# Patient Record
Sex: Female | Born: 1960 | Hispanic: No | Marital: Single | Smoking: Never smoker
Health system: Southern US, Community
[De-identification: ages and names within clinical notes are randomized; demographics above are authoritative.]

---

## 2004-09-13 ENCOUNTER — Other Ambulatory Visit: Admission: RE | Admit: 2004-09-13 | Discharge: 2004-09-13 | Payer: Self-pay | Admitting: Family Medicine

## 2004-09-13 ENCOUNTER — Ambulatory Visit: Payer: Self-pay | Admitting: Family Medicine

## 2004-09-15 ENCOUNTER — Ambulatory Visit: Payer: Self-pay | Admitting: Family Medicine

## 2004-09-16 ENCOUNTER — Ambulatory Visit: Payer: Self-pay | Admitting: Family Medicine

## 2004-09-21 ENCOUNTER — Ambulatory Visit: Payer: Self-pay | Admitting: Oncology

## 2004-09-22 ENCOUNTER — Ambulatory Visit: Payer: Self-pay | Admitting: Family Medicine

## 2004-09-22 ENCOUNTER — Encounter: Admission: RE | Admit: 2004-09-22 | Discharge: 2004-09-22 | Payer: Self-pay | Admitting: Family Medicine

## 2004-09-29 ENCOUNTER — Ambulatory Visit: Payer: Self-pay | Admitting: Family Medicine

## 2004-11-07 ENCOUNTER — Ambulatory Visit: Payer: Self-pay | Admitting: Oncology

## 2005-11-03 ENCOUNTER — Encounter: Admission: RE | Admit: 2005-11-03 | Discharge: 2005-11-03 | Payer: Self-pay | Admitting: Family Medicine

## 2007-01-25 ENCOUNTER — Encounter: Admission: RE | Admit: 2007-01-25 | Discharge: 2007-01-25 | Payer: Self-pay | Admitting: Family Medicine

## 2008-02-14 ENCOUNTER — Encounter: Admission: RE | Admit: 2008-02-14 | Discharge: 2008-02-14 | Payer: Self-pay | Admitting: Family Medicine

## 2008-02-18 ENCOUNTER — Encounter (INDEPENDENT_AMBULATORY_CARE_PROVIDER_SITE_OTHER): Payer: Self-pay | Admitting: *Deleted

## 2018-05-04 ENCOUNTER — Other Ambulatory Visit: Payer: Self-pay

## 2018-05-04 ENCOUNTER — Emergency Department (HOSPITAL_BASED_OUTPATIENT_CLINIC_OR_DEPARTMENT_OTHER): Payer: BLUE CROSS/BLUE SHIELD

## 2018-05-04 ENCOUNTER — Encounter: Payer: Self-pay | Admitting: Family Medicine

## 2018-05-04 ENCOUNTER — Emergency Department (HOSPITAL_BASED_OUTPATIENT_CLINIC_OR_DEPARTMENT_OTHER)
Admission: EM | Admit: 2018-05-04 | Discharge: 2018-05-04 | Disposition: A | Discharge status: Home / Self Care | Payer: BLUE CROSS/BLUE SHIELD | Attending: Emergency Medicine | Admitting: Emergency Medicine

## 2018-05-04 ENCOUNTER — Ambulatory Visit: Payer: BLUE CROSS/BLUE SHIELD | Admitting: Family Medicine

## 2018-05-04 ENCOUNTER — Encounter (HOSPITAL_BASED_OUTPATIENT_CLINIC_OR_DEPARTMENT_OTHER): Payer: Self-pay | Admitting: Emergency Medicine

## 2018-05-04 VITALS — BP 132/81 | HR 92 | Temp 97.5°F | Resp 17 | Ht 65.0 in | Wt 147.0 lb

## 2018-05-04 DIAGNOSIS — R519 Headache, unspecified: Secondary | ICD-10-CM

## 2018-05-04 DIAGNOSIS — R51 Headache: Secondary | ICD-10-CM | POA: Diagnosis present

## 2018-05-04 DIAGNOSIS — Z1211 Encounter for screening for malignant neoplasm of colon: Secondary | ICD-10-CM | POA: Diagnosis not present

## 2018-05-04 DIAGNOSIS — Z1239 Encounter for other screening for malignant neoplasm of breast: Secondary | ICD-10-CM

## 2018-05-04 DIAGNOSIS — H519 Unspecified disorder of binocular movement: Secondary | ICD-10-CM

## 2018-05-04 LAB — BASIC METABOLIC PANEL
ANION GAP: 14 (ref 5–15)
BUN: 9 mg/dL (ref 6–20)
CO2: 26 mmol/L (ref 22–32)
CREATININE: 0.59 mg/dL (ref 0.44–1.00)
Calcium: 10 mg/dL (ref 8.9–10.3)
Chloride: 97 mmol/L — ABNORMAL LOW (ref 98–111)
GFR calc non Af Amer: >60 mL/min (ref >60)
GLUCOSE: 111 mg/dL — AB (ref 70–99)
Potassium: 3 mmol/L — ABNORMAL LOW (ref 3.5–5.1)
Sodium: 137 mmol/L (ref 135–145)

## 2018-05-04 LAB — CBC
HEMATOCRIT: 41 % (ref 36.0–46.0)
HEMOGLOBIN: 12 g/dL (ref 12.0–15.0)
MCH: 22.7 pg — ABNORMAL LOW (ref 26.0–34.0)
MCHC: 29.3 g/dL — AB (ref 30.0–36.0)
MCV: 77.7 fL — AB (ref 80.0–100.0)
Platelets: 339 10*3/uL (ref 150–400)
RBC: 5.28 MIL/uL — ABNORMAL HIGH (ref 3.87–5.11)
RDW: 16.8 % — AB (ref 11.5–15.5)
WBC: 10.6 10*3/uL — AB (ref 4.0–10.5)
nRBC: 0 % (ref 0.0–0.2)

## 2018-05-04 MED ORDER — PROCHLORPERAZINE EDISYLATE 10 MG/2ML IJ SOLN
10.0000 mg | Freq: Once | INTRAMUSCULAR | Status: AC
  Administered 2018-05-04: 10 mg via INTRAVENOUS
  Filled 2018-05-04: qty 2

## 2018-05-04 MED ORDER — BUTALBITAL-APAP-CAFFEINE 50-325-40 MG PO TABS: 1.0000 | ORAL_TABLET | Freq: Four times a day (QID) | ORAL | 0 refills | Status: AC | PRN

## 2018-05-04 MED ORDER — MORPHINE SULFATE (PF) 4 MG/ML IV SOLN
4.0000 mg | Freq: Once | INTRAVENOUS | Status: AC
  Administered 2018-05-04: 4 mg via INTRAVENOUS
  Filled 2018-05-04: qty 1

## 2018-05-04 MED ORDER — POTASSIUM CHLORIDE CRYS ER 20 MEQ PO TBCR
40.0000 meq | EXTENDED_RELEASE_TABLET | Freq: Once | ORAL | Status: AC
  Administered 2018-05-04: 40 meq via ORAL
  Filled 2018-05-04: qty 2

## 2018-05-04 MED ORDER — BUTALBITAL-APAP-CAFFEINE 50-325-40 MG PO TABS: 1.0000 | ORAL_TABLET | Freq: Four times a day (QID) | ORAL | 0 refills | Status: DC | PRN

## 2018-05-04 NOTE — Progress Notes (Signed)
Patient ID: Rachel Ortiz, female    DOB: June 19, 1961, 57 y.o.   MRN: [id number]  PCP: Patient, No Pcp Per  Chief Complaint  Patient presents with  . Headache    onset: Monday 04/29/18 and are getting progessively worse daily, ha's are intermittent and a "pulling sensation" and with the pulling sensation pain is 10/10 , pain is trobbing also and getting more frequent.  No sensitiviyt to light  or sound, no nausea or vomitting.  Stopped tylenol last night as it is not helping and leaves bad tase in mouth.    Subjective:  HPI Rachel Ortiz is a 57 y.o. female presents for evaluation of headache. Headache is unilateral. Headaches are new.  Patient has never had any prior history of migraines or head injuries.She reports acute onset of a pulling sensation on the left side of her head which began approximately 5 days ago. The pain is occurring on the left lower occipital area of her head and occurs intermittently as a strong pulling sensation.  She denies any left-sided facial weakness, visual disturbances, nausea, vomiting.  He has attempted relief with Tylenol which has not improved symptoms which only made her ill on her stomach.  Reports altered taste of food however has attributed that to taken the Tylenol.  Pain is occurring she characterizes it as a 10 out of 10.  She has had 2 painful episodes during her office visit and she is gripping her head while the pain is occurring.  Is currently not dizzy able to stand and reports no imbalance with walking. No apparent risk factors for CVA. She is a non-smoker, normotensive, no other no neurovascular disease. Social History   Socioeconomic History  . Marital status: Single    Spouse name: Not on file  . Number of children: Not on file  . Years of education: Not on file  . Highest education level: Not on file  Occupational History  . Not on file  Social Needs  . Financial resource strain: Not on file  . Food insecurity:    Worry: Not on file    Inability: Not  on file  . Transportation needs:    Medical: Not on file    Non-medical: Not on file  Tobacco Use  . Smoking status: Never Smoker  . Smokeless tobacco: Never Used  Substance and Sexual Activity  . Alcohol use: Never    Frequency: Never  . Drug use: Never  . Sexual activity: Not on file  Lifestyle  . Physical activity:    Days per week: Not on file    Minutes per session: Not on file  . Stress: Not on file  Relationships  . Social connections:    Talks on phone: Not on file    Gets together: Not on file    Attends religious service: Not on file    Active member of club or organization: Not on file    Attends meetings of clubs or organizations: Not on file    Relationship status: Not on file  . Intimate partner violence:    Fear of current or ex partner: Not on file    Emotionally abused: Not on file    Physically abused: Not on file    Forced sexual activity: Not on file  Other Topics Concern  . Not on file  Social History Narrative  . Not on file    No known family history of cardiovascular disease, cancer, lung disease, or diabetes.   Review of Systems Pertinent  negatives listed in HPI There are no active problems to display for this patient.   No Known Allergies  Prior to Admission medications   Not on File    Past Medical, Surgical Family and Social History reviewed and updated.    Objective:   Today's Vitals   05/04/18 1038  BP: 132/81  Pulse: 92  Resp: 17  Temp: (!) 97.5 F (36.4 C)  TempSrc: Oral  SpO2: 100%  Weight: 147 lb (66.7 kg)  Height: 5\' 5"  (1.651 m)    Wt Readings from Last 3 Encounters:  05/04/18 147 lb (66.7 kg)    Physical Exam General appearance: alert, well developed, well nourished, cooperative and in no distress Head: Normocephalic, without obvious abnormality, atraumatic Respiratory: Respirations even and unlabored, normal respiratory rate Heart: rate and rhythm normal. No gallop or murmurs noted on exam  Extremities:  No gross deformities Skin: Skin color, texture, turgor normal. No rashes seen  Psych: Appropriate mood and affect. Neurologic: Mental status: Alert, oriented to person, place, and time, thought content appropriate.  During ocular exam patient's eyes are moving rapidly from side to side.  There is some delay in pupil dilation when responding to light most prominently in the left eye.  However this may be related to patient's inability to focus exam.  Strength of BLE and BUE 5/5.  Assessment & Plan:  1. Unilateral occipital headache, attempted to order an outpatient CT of the head without contrast however due to it is the weekend patient's insurance cannot be verified. Patient is being deferred to ED for further work-up and evaluation. Given current evaluation, okay to for family to transport to ER from clinic.  2. Breast screening - MM Digital Screening; Future   3. Colon cancer screening - Ambulatory referral to Gastroenterology  4. Abnormal eye movements -Uncertain if this is reactionary or an actual underlying symptom related to a neurological problem.  Therefore patient is being referred for further evaluation to have a med Center ER for a CT of the Head.     Patient has been referred emergently to [facility] ER for further evaluation and a CT scan of the head.  Patient's symptoms are inconsistent with an acute stroke however this cannot be ruled out.  Patient and family advised if CT is positive for CVA and or brain bleed, she will be transported via EMS to [facility]  -The patient was given clear instructions to go to ER or return to medical center if symptoms do not improve, worsen or new problems develop. The patient verbalized understanding.    A total of 35 minutes spent, greater than 50 % of this time was spent counseling and coordination of care.       Godfrey Pick. Tiburcio Pea, FNP-C Nurse Practitioner (PRN Staff)  Primary Care at [facility] [address]  [place], [place]  [phone]

## 2018-05-04 NOTE — ED Notes (Signed)
Pt given rx x 1 for fioricet. D/c home with family. Ambulatory to d/c window with steady gait

## 2018-05-04 NOTE — ED Provider Notes (Signed)
[facility] EMERGENCY DEPARTMENT Provider Note   CSN: [id number] Arrival date & time: 05/04/18  1139     History   Chief Complaint Chief Complaint  Patient presents with  . Headache    HPI Rachel Ortiz is a 57 y.o. female.  HPI Patient presents to the emergency room for evaluation of a headache.  Patient states her symptoms initially started 5 days ago.  Since that time she has had intermittent episodes of sharp severe pain in her head that comes and goes.  These will last few seconds at a time.  Pain is primarily in the posterior aspect and the top of her head.  She denies any trouble with vision or weakness.  She denies any trouble with her speech.  She denies any recent injuries.  She went to urgent care today who recommended she come to the emergency room to get a head CT. History reviewed. No pertinent past medical history.  There are no active problems to display for this patient.   History reviewed. No pertinent surgical history.   OB History   None      Home Medications    Prior to Admission medications   Not on File    Family History No family history on file.  Social History Social History   Tobacco Use  . Smoking status: Never Smoker  . Smokeless tobacco: Never Used  Substance Use Topics  . Alcohol use: Never    Frequency: Never  . Drug use: Never     Allergies   Patient has no known allergies.   Review of Systems Review of Systems  All other systems reviewed and are negative.    Physical Exam Updated Vital Signs BP 130/83 (BP Location: Left Arm)   Pulse 88   Temp 98.5 F (36.9 C) (Oral)   Resp 20   Ht 1.651 m (5\' 5" )   Wt 66.7 kg   SpO2 100%   BMI 24.47 kg/m   Physical Exam  Constitutional: She appears well-developed and well-nourished. No distress.  HENT:  Head: Normocephalic and atraumatic.  Right Ear: External ear normal.  Left Ear: External ear normal.  Eyes: Conjunctivae are normal. Right eye exhibits no  discharge. Left eye exhibits no discharge. No scleral icterus.  Neck: Neck supple. No tracheal deviation present.  Cardiovascular: Normal rate, regular rhythm and intact distal pulses.  Pulmonary/Chest: Effort normal and breath sounds normal. No stridor. No respiratory distress. She has no wheezes. She has no rales.  Abdominal: Soft. Bowel sounds are normal. She exhibits no distension. There is no tenderness. There is no rebound and no guarding.  Musculoskeletal: She exhibits no edema or tenderness.  Neurological: She is alert. She has normal strength. No cranial nerve deficit (no facial droop, extraocular movements intact, no slurred speech) or sensory deficit. She exhibits normal muscle tone. She displays no seizure activity. Coordination normal.  Skin: Skin is warm and dry. No rash noted.  Psychiatric: She has a normal mood and affect.  Nursing note and vitals reviewed.    ED Treatments / Results  Labs (all labs ordered are listed, but only abnormal results are displayed) Labs Reviewed  CBC - Abnormal; Notable for the following components:      Result Value   WBC 10.6 (*)    RBC 5.28 (*)    MCV 77.7 (*)    MCH 22.7 (*)    MCHC 29.3 (*)    RDW 16.8 (*)    All other components within normal  limits  BASIC METABOLIC PANEL - Abnormal; Notable for the following components:   Potassium 3.0 (*)    Chloride 97 (*)    Glucose, Bld 111 (*)    All other components within normal limits    Radiology Ct Head Wo Contrast  Result Date: 05/04/2018 CLINICAL DATA:  57 year old with left sided headaches. EXAM: CT HEAD WITHOUT CONTRAST TECHNIQUE: Contiguous axial images were obtained from the base of the skull through the vertex without intravenous contrast. COMPARISON:  None. FINDINGS: Brain: No evidence for acute hemorrhage, mass lesion, midline shift, hydrocephalus or large infarct. Vascular: No hyperdense vessel or unexpected calcification. Skull: Normal. Negative for fracture or focal lesion.  Sinuses/Orbits: Small amount of mucosal disease in the right maxillary sinus. Otherwise, the visualized sinuses are clear. Other: None IMPRESSION: No acute intracranial abnormality. Electronically Signed   By: Richarda Overlie M.D.   On: 05/04/2018 14:11    Procedures Procedures (including critical care time)  Medications Ordered in ED Medications  morphine 4 MG/ML injection 4 mg (4 mg Intravenous Given 05/04/18 1449)  prochlorperazine (COMPAZINE) injection 10 mg (10 mg Intravenous Given 05/04/18 1444)  potassium chloride SA (K-DUR,KLOR-CON) CR tablet 40 mEq (40 mEq Oral Given 05/04/18 1442)     Initial Impression / Assessment and Plan / ED Course  I have reviewed the triage vital signs and the nursing notes.  Pertinent labs & imaging results that were available during my care of the patient were reviewed by me and considered in my medical decision making (see chart for details).  Clinical Course as of May 04 1520  Sat May 04, 2018  1429 CT scan negative.    Labs normal   [JK]    Clinical Course User Index [JK] Linwood Dibbles, MD    Pt presented with intermittent sharp headache.   Pt had onset several days ago but acute headache today.  CT scan negative for bleed.  Sx atypical for sentinel type headache.  No signs of infection.  Normal neuro exam.  ?cluster headache.  Will dc home with pain meds.  Follow up with PCP or neurologist.  Warning signs and precautions discussed.  Final Clinical Impressions(s) / ED Diagnoses   Final diagnoses:  Bad headache    ED Discharge Orders         Ordered    butalbital-acetaminophen-caffeine (FIORICET, ESGIC) 50-325-40 MG tablet  Every 6 hours PRN,   Status:  Discontinued     05/04/18 1517           Linwood Dibbles, MD 05/04/18 1521

## 2018-05-04 NOTE — Patient Instructions (Signed)
     If you have lab work done today you will be contacted with your lab results within the next 2 weeks.  If you have not heard from us then please contact us. The fastest way to get your results is to register for My Chart.   IF you received an x-ray today, you will receive an invoice from [facility]. Please contact [facility] at [phone] with questions or concerns regarding your invoice.   IF you received labwork today, you will receive an invoice from [facility]. Please contact [facility] at [phone] with questions or concerns regarding your invoice.   Our billing staff will not be able to assist you with questions regarding bills from these companies.  You will be contacted with the lab results as soon as they are available. The fastest way to get your results is to activate your My Chart account. Instructions are located on the last page of this paperwork. If you have not heard from us regarding the results in 2 weeks, please contact this office.    We recommend that you schedule a mammogram for breast cancer screening. Typically, you do not need a referral to do this. Please contact a local imaging center to schedule your mammogram.  [facility] - [phone]  *ask for the Radiology Department [facility]) - [phone] or [phone]  [facility] - [phone] [facility] - [phone] [facility] - [phone]  *ask for the Radiology Department [facility] - [phone]  *ask for the Radiology Department [facility] - [phone]  *ask for the Mammography Department [facility] - [phone] 

## 2018-05-04 NOTE — Discharge Instructions (Signed)
Take the medications as prescribed, follow up with a neurologist for further evaluation, return to the ED for worsening symptoms

## 2018-05-04 NOTE — ED Notes (Signed)
ED Provider at bedside. 

## 2018-05-04 NOTE — ED Triage Notes (Signed)
Pt sent here from primary care at [place] for further eval of severe headache x 5 days. Pt denies injury, changes in vision, or weakness.

## 2018-05-11 ENCOUNTER — Encounter: Payer: Self-pay | Admitting: Family Medicine

## 2018-05-11 DIAGNOSIS — R519 Headache, unspecified: Secondary | ICD-10-CM | POA: Insufficient documentation

## 2018-05-11 DIAGNOSIS — R51 Headache: Principal | ICD-10-CM

## 2018-06-01 ENCOUNTER — Ambulatory Visit: Payer: Self-pay | Admitting: Family Medicine

## 2018-07-05 ENCOUNTER — Encounter: Payer: Self-pay | Admitting: Family Medicine

## 2019-08-20 IMAGING — CT CT HEAD W/O CM
3 series · 16 of 47 positions shown, 19 images · non-contrast
Comparison: None.

CLINICAL DATA: 57-year-old with left sided headaches.

EXAM:
CT HEAD WITHOUT CONTRAST
TECHNIQUE: Contiguous axial images were obtained from the base of the skull
through the vertex without intravenous contrast.

[Series 2: head wo · axial · 0.40mm/px · z∈[+792,+932]mm · 10 of 34 slices shown, 13 images]
[im 3/34  brain]
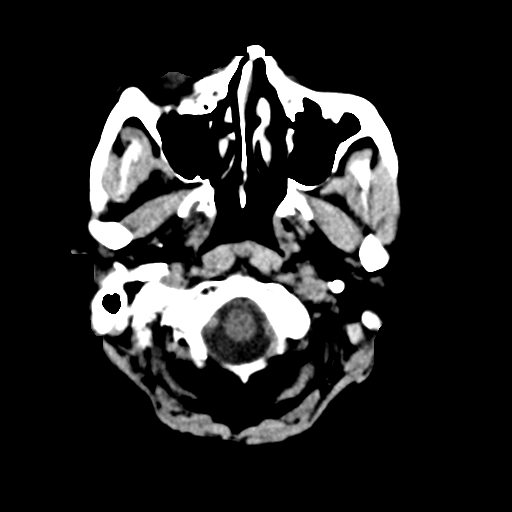
[im 3/34  bone]
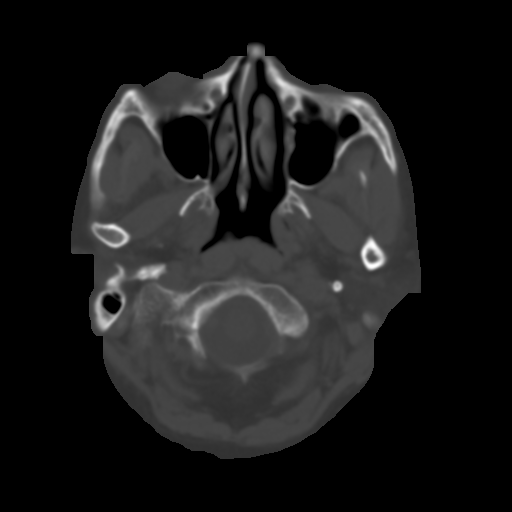
[im 6/34  brain]
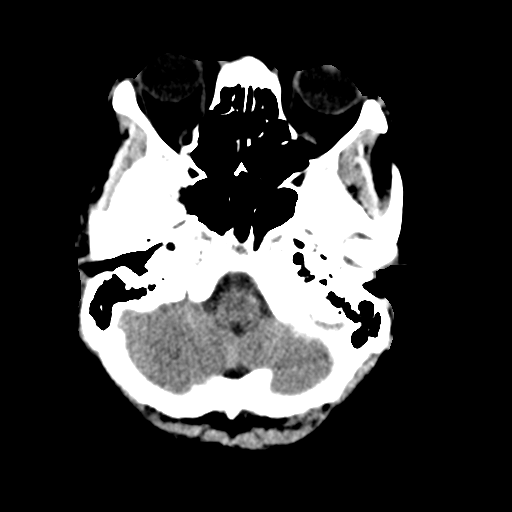
[im 10/34  brain]
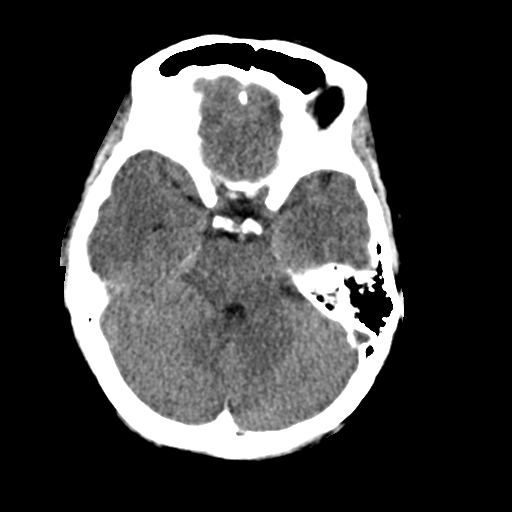
[im 12/34  brain]
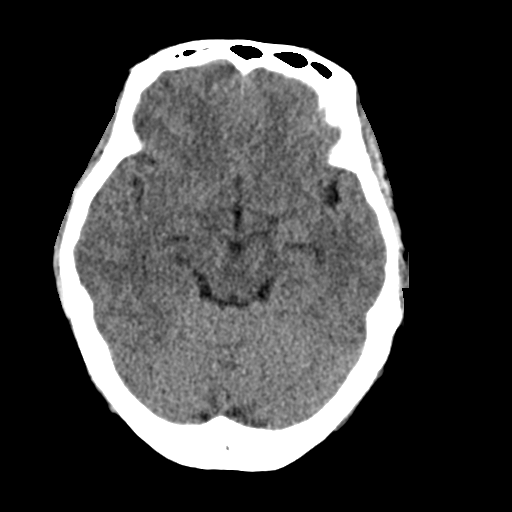
[im 15/34  brain]
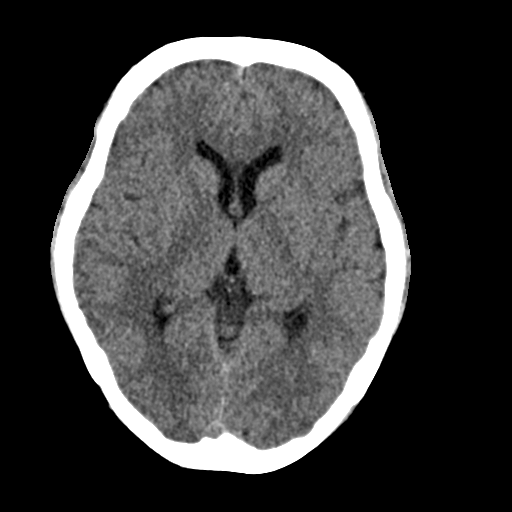
[im 15/34  bone]
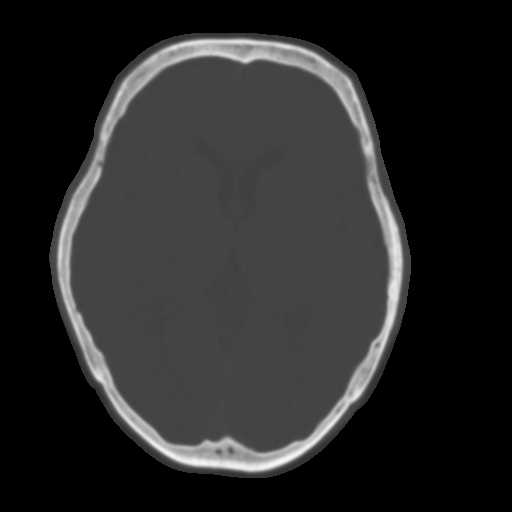
[im 19/34  brain]
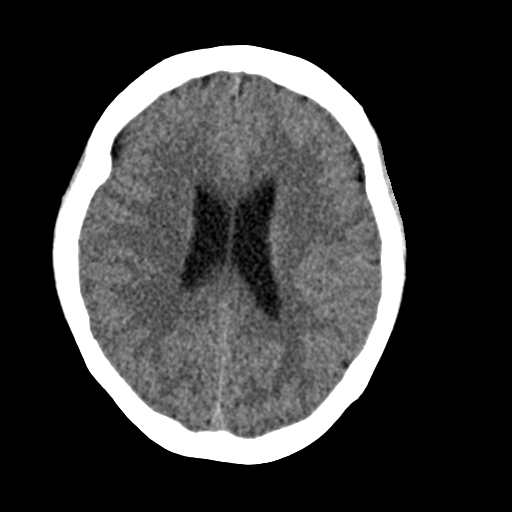
[im 22/34  brain]
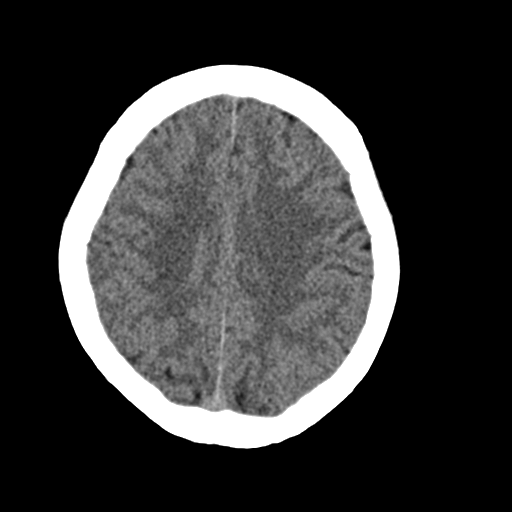
[im 26/34  brain]
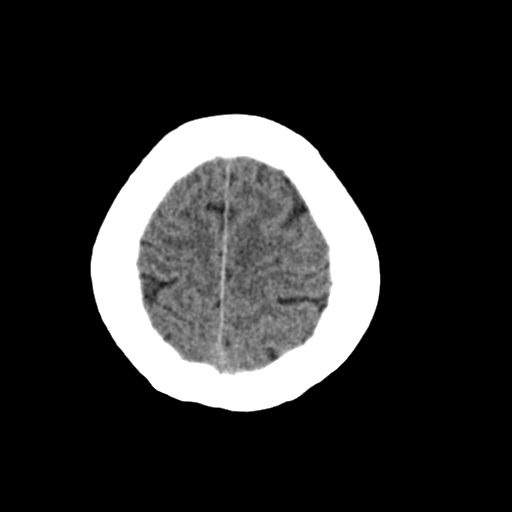
[im 28/34  brain]
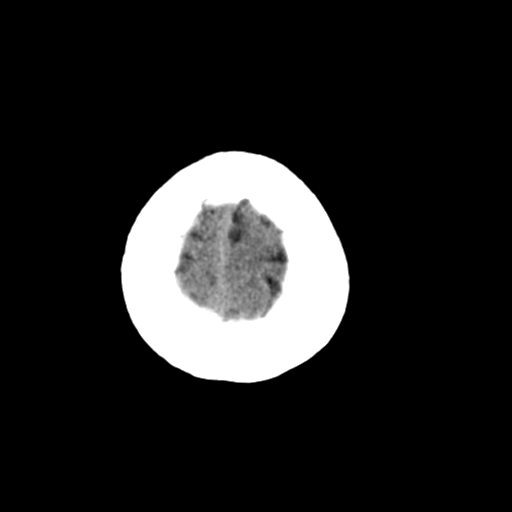
[im 28/34  bone]
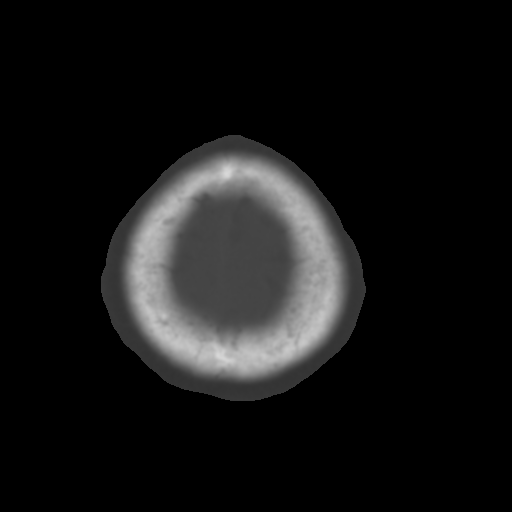
[im 31/34  brain]
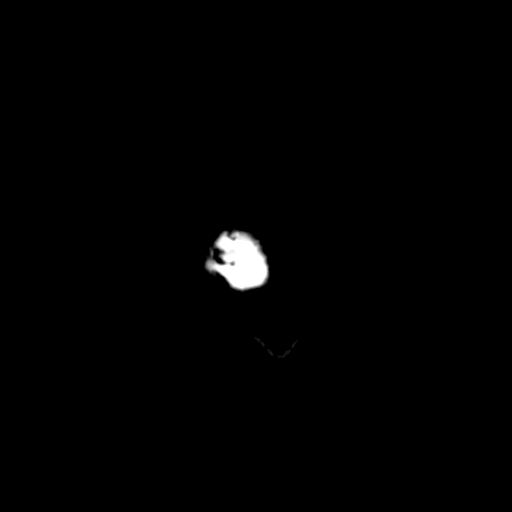

[Series 4: coronal soft · coronal · 0.34mm/px · 3 of 65 slices shown]
[im 22/65  brain]
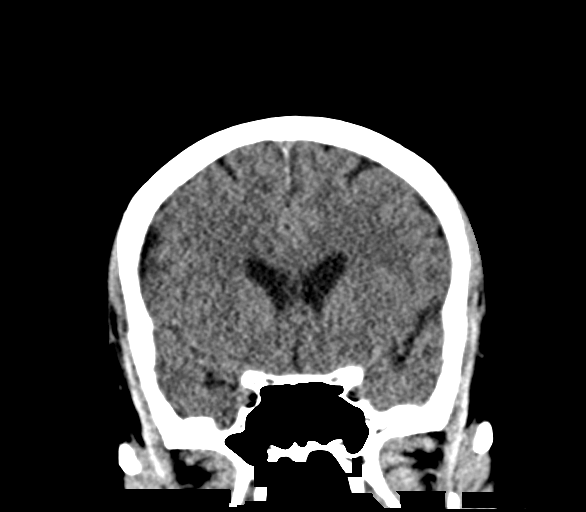
[im 29/65  brain]
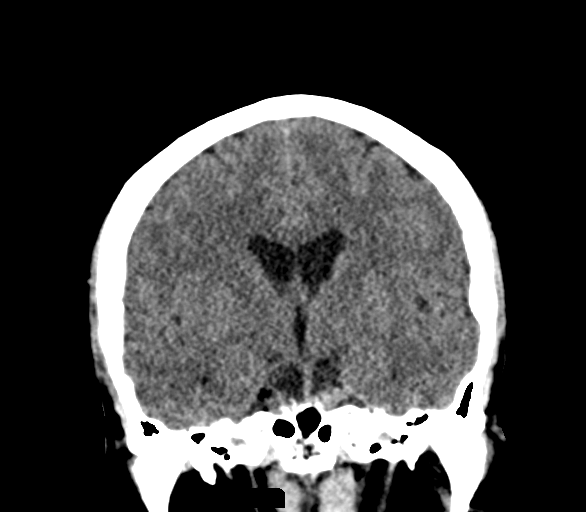
[im 36/65  brain]
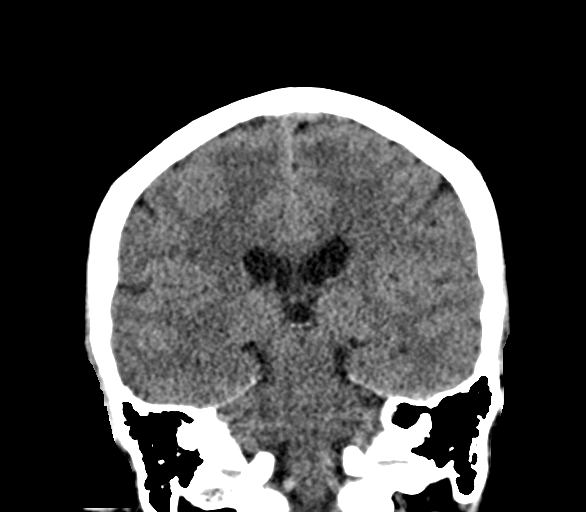

[Series 5: sag soft · sagittal · 0.34mm/px · 3 of 54 slices shown]
[im 18/54  brain]
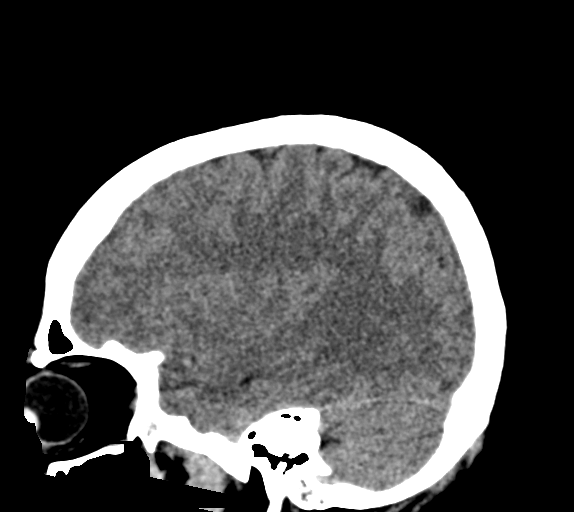
[im 27/54  brain]
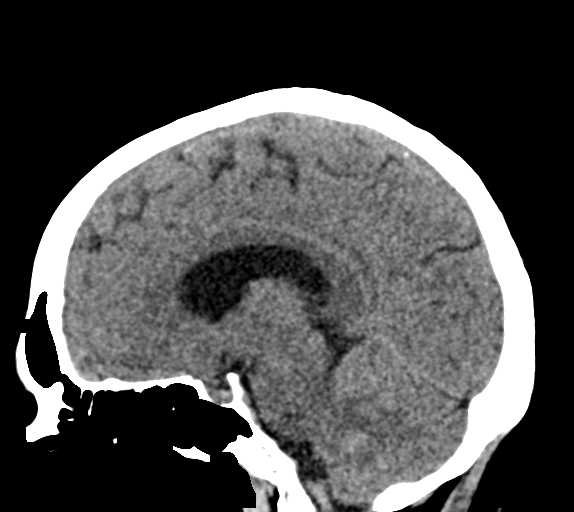
[im 36/54  brain]
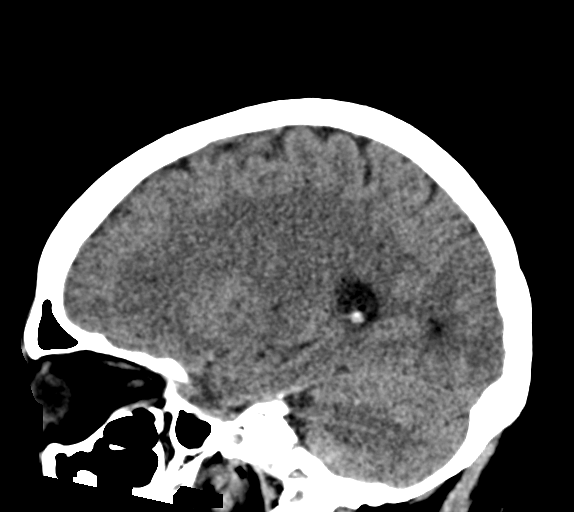

[16 of 47 positions shown; findings below may reference images not displayed]

FINDINGS: Brain: No evidence for acute hemorrhage, mass lesion, midline shift,
hydrocephalus or large infarct.

Vascular: No hyperdense vessel or unexpected calcification.

Skull: Normal. Negative for fracture or focal lesion.

Sinuses/Orbits: Small amount of mucosal disease in the right
maxillary sinus. Otherwise, the visualized sinuses are clear.

Other: None
IMPRESSION: No acute intracranial abnormality.
